# Patient Record
Sex: Female | Born: 1945 | Race: White | Hispanic: No | Marital: Married
Health system: Southern US, Community
[De-identification: ages and names within clinical notes are randomized; demographics above are authoritative.]

---

## 2016-12-29 ENCOUNTER — Emergency Department (HOSPITAL_COMMUNITY): Payer: Medicare HMO

## 2016-12-29 ENCOUNTER — Emergency Department (HOSPITAL_COMMUNITY)
Admission: EM | Admit: 2016-12-29 | Discharge: 2016-12-29 | Disposition: A | Discharge status: Home / Self Care | Payer: Medicare HMO | Attending: Emergency Medicine | Admitting: Emergency Medicine

## 2016-12-29 DIAGNOSIS — R52 Pain, unspecified: Secondary | ICD-10-CM

## 2016-12-29 DIAGNOSIS — S99912A Unspecified injury of left ankle, initial encounter: Secondary | ICD-10-CM | POA: Diagnosis present

## 2016-12-29 DIAGNOSIS — Y9301 Activity, walking, marching and hiking: Secondary | ICD-10-CM | POA: Diagnosis not present

## 2016-12-29 DIAGNOSIS — Y999 Unspecified external cause status: Secondary | ICD-10-CM | POA: Diagnosis not present

## 2016-12-29 DIAGNOSIS — Y92520 Airport as the place of occurrence of the external cause: Secondary | ICD-10-CM | POA: Diagnosis not present

## 2016-12-29 DIAGNOSIS — S82852A Displaced trimalleolar fracture of left lower leg, initial encounter for closed fracture: Secondary | ICD-10-CM | POA: Insufficient documentation

## 2016-12-29 DIAGNOSIS — W010XXA Fall on same level from slipping, tripping and stumbling without subsequent striking against object, initial encounter: Secondary | ICD-10-CM | POA: Diagnosis not present

## 2016-12-29 MED ORDER — FENTANYL CITRATE (PF) 100 MCG/2ML IJ SOLN
50.0000 ug | Freq: Once | INTRAMUSCULAR | Status: AC
  Administered 2016-12-29: 50 ug via INTRAVENOUS
  Filled 2016-12-29: qty 2

## 2016-12-29 MED ORDER — PROPOFOL 10 MG/ML IV BOLUS
0.5000 mg/kg | Freq: Once | INTRAVENOUS | Status: AC
  Administered 2016-12-29: 25 mg via INTRAVENOUS
  Filled 2016-12-29: qty 20

## 2016-12-29 MED ORDER — OXYCODONE-ACETAMINOPHEN 5-325 MG PO TABS: 1.0000 | ORAL_TABLET | Freq: Four times a day (QID) | ORAL | 0 refills | Status: AC | PRN

## 2016-12-29 MED ORDER — OXYCODONE-ACETAMINOPHEN 5-325 MG PO TABS
2.0000 | ORAL_TABLET | Freq: Once | ORAL | Status: AC
  Administered 2016-12-29: 2 via ORAL
  Filled 2016-12-29: qty 2

## 2016-12-29 MED ORDER — KETAMINE HCL-SODIUM CHLORIDE 100-0.9 MG/10ML-% IV SOSY
1.0000 mg/kg | PREFILLED_SYRINGE | Freq: Once | INTRAVENOUS | Status: AC
  Administered 2016-12-29: 25 mg via INTRAVENOUS
  Filled 2016-12-29: qty 10

## 2016-12-29 NOTE — ED Triage Notes (Signed)
[place] and [place]. Pt presents with obvious deformity to LLE, swelling, and pain 10/10

## 2016-12-29 NOTE — Discharge Instructions (Signed)
Please keep the leg elevated AT ALL TIMES - TOES ABOVE THE NOSE.  No weight bearing allowed. See the Orthopedist within 1 week.

## 2016-12-29 NOTE — ED Provider Notes (Addendum)
[facility]-EMERGENCY DEPT Provider Note   CSN: [id number] Arrival date & time: 12/29/16  1305   By signing my name below, I, Laura Gallagher, attest that this documentation has been prepared under the direction and in the presence of Derwood Kaplan, MD . Electronically Signed: Freida Gallagher, Scribe. 12/29/2016. 2:58 PM.  History   Chief Complaint Chief Complaint  Patient presents with  . Leg Injury    The history is provided by the patient. No language interpreter was used.    HPI Comments:  Laura Gallagher is a 71 y.o. female who presents to the Emergency Department complaining of constant pain to the left leg s/p injury ~1210 today. She tripped over her suitcase while walking. No LOC. Pt notes associated pain to her buttocks. She has no other acute complaints or injuries. She denies h/o injury to the left ankle. Pt last ate ~0730/0800 this AM. Pt weighed 112 pounds this AM.    No past medical history on file.  There are no active problems to display for this patient.   No past surgical history on file.  OB History    No data available       Home Medications    Prior to Admission medications   Medication Sig Start Date End Date Taking? Authorizing Provider  acetaminophen (TYLENOL) 500 MG tablet Take 1,000 mg by mouth every 6 (six) hours as needed for moderate pain.   Yes Historical Provider, MD  calcium-vitamin D (OSCAL WITH D) 500-200 MG-UNIT tablet Take 1 tablet by mouth daily with breakfast.   Yes Historical Provider, MD  cetirizine (ZYRTEC) 10 MG tablet Take 10 mg by mouth daily.   Yes Historical Provider, MD  ibuprofen (ADVIL,MOTRIN) 200 MG tablet Take 400 mg by mouth every 6 (six) hours as needed for moderate pain.   Yes Historical Provider, MD  lisinopril (PRINIVIL,ZESTRIL) 30 MG tablet Take 30 mg by mouth daily.  10/18/16  Yes Historical Provider, MD  LYRICA 75 MG capsule Take 75 mg by mouth at bedtime.  12/20/16  Yes Historical Provider, MD  magnesium oxide (MAG-OX) 400  MG tablet Take 400-600 mg by mouth daily. Takes 1 tablet one night and 1&1/2 the next night.Last dose was 1 tablet.   Yes Historical Provider, MD  oxcarbazepine (TRILEPTAL) 600 MG tablet Take 600 mg by mouth 2 (two) times daily.  11/23/16  Yes Historical Provider, MD  oxyCODONE-acetaminophen (PERCOCET/ROXICET) 5-325 MG tablet Take 1-2 tablets by mouth every 6 (six) hours as needed for severe pain. 12/29/16   Derwood Kaplan, MD    Family History No family history on file.  Social History Social History  Substance Use Topics  . Smoking status: Not on file  . Smokeless tobacco: Not on file  . Alcohol use Not on file     Allergies   Patient has no known allergies.   Review of Systems Review of Systems 10 systems reviewed and all are negative for acute change except as noted in the HPI.   Physical Exam Updated Vital Signs BP (!) 156/83 (BP Location: Right Arm)   Pulse 85   Temp 97.5 F (36.4 C) (Oral)   Resp 16   Wt 112 lb (50.8 kg)   SpO2 100%   Physical Exam  Constitutional: She is oriented to person, place, and time. She appears well-developed and well-nourished. No distress.  HENT:  Head: Normocephalic and atraumatic.  Eyes: Conjunctivae are normal.  Neck: Normal range of motion.  No midline C-spine tenderness  Cardiovascular: Normal rate, regular  rhythm and normal heart sounds.   Pulmonary/Chest: Effort normal and breath sounds normal. No respiratory distress.  Lungs clear to auscultation  Abdominal: Soft. There is no tenderness.  Musculoskeletal: She exhibits deformity.  Deformed left ankle/foot. Pt has 2+ dorsalis pedis sensation intact.  Neurological: She is alert and oriented to person, place, and time.  Skin: Skin is warm and dry.  Psychiatric: She has a normal mood and affect.  Nursing note and vitals reviewed.    ED Treatments / Results  DIAGNOSTIC STUDIES:  Oxygen Saturation is 98% on RA, normal by my interpretation.    COORDINATION OF CARE:  2:54  PM Pt in need of reduction. Discussed treatment plan with pt at bedside and pt agreed to plan.  Labs (all labs ordered are listed, but only abnormal results are displayed) Labs Reviewed - No data to display  EKG  EKG Interpretation None       Radiology Dg Ankle Complete Left  Result Date: 12/29/2016 CLINICAL DATA:  Twisting left ankle after falling on luggage. EXAM: LEFT ANKLE COMPLETE - 3+ VIEW COMPARISON:  None. FINDINGS: Three-view study limited by patient positioning. Comminuted fracture dislocation noted at the ankle. Medial malleolar fracture is associated with distal fibula fracture and posterolateral dislocation of the talar dome relative to the distal tibia. Associated posterior lip fracture distal tibia suspected. IMPRESSION: Trimalleolar fracture dislocation of the left ankle. Electronically Signed   By: Kennith Center M.D.   On: 12/29/2016 15:02   Dg Ankle Left Port  Result Date: 12/29/2016 CLINICAL DATA:  Post reduction of tibiotalar dislocation EXAM: PORTABLE LEFT ANKLE - 2 VIEW COMPARISON:  Pre reduction films of 12/29/2016 FINDINGS: The tibiotalar dislocation has been reduced. Fractures of the medial, lateral, and posterior malleoli again are noted. IMPRESSION: Reduction of tibiotalar dislocation. Electronically Signed   By: Dwyane Dee M.D.   On: 12/29/2016 17:00    Procedures Reduction of fracture Date/Time: 12/29/2016 5:04 PM Performed by: Derwood Kaplan Authorized by: Derwood Kaplan  Consent: Verbal consent obtained. Written consent obtained. Risks and benefits: risks, benefits and alternatives were discussed Consent given by: patient Patient understanding: patient states understanding of the procedure being performed Patient consent: the patient's understanding of the procedure matches consent given Procedure consent: procedure consent matches procedure scheduled Relevant documents: relevant documents present and verified Required items: required blood  products, implants, devices, and special equipment available Patient identity confirmed: arm band Time out: Immediately prior to procedure a "time out" was called to verify the correct patient, procedure, equipment, support staff and site/side marked as required. Preparation: Patient was prepped and draped in the usual sterile fashion. Local anesthesia used: no  Anesthesia: Local anesthesia used: no Patient tolerance: Patient tolerated the procedure well with no immediate complications     Procedural sedation Performed by: Derwood Kaplan Consent: Verbal consent obtained. Risks and benefits: risks, benefits and alternatives were discussed Required items: required blood products, implants, devices, and special equipment available Patient identity confirmed: arm band and provided demographic data Time out: Immediately prior to procedure a "time out" was called to verify the correct patient, procedure, equipment, support staff and site/side marked as required.  Sedation type: moderate (conscious) sedation NPO time confirmed and considedered  Sedatives: KETAMINE 25 mg and PROPOFOL 25 mg  Physician Time at Bedside: 24 min  Vitals: Vital signs were monitored during sedation. Cardiac Monitor, pulse oximeter Patient tolerance: Patient tolerated the procedure well with no immediate complications. Comments: Pt with uneventful recovered. Returned to pre-procedural sedation baseline    (  including critical care time)    Medications Ordered in ED Medications  oxyCODONE-acetaminophen (PERCOCET/ROXICET) 5-325 MG per tablet 2 tablet (not administered)  fentaNYL (SUBLIMAZE) injection 50 mcg (50 mcg Intravenous Given 12/29/16 1502)  ketamine 100 mg in normal saline 10 mL (10mg /mL) syringe (25 mg Intravenous Given 12/29/16 1630)  propofol (DIPRIVAN) 10 mg/mL bolus/IV push 25.4 mg (25 mg Intravenous Given 12/29/16 1629)     Initial Impression / Assessment and Plan / ED Course  I have reviewed  the triage vital signs and the nursing notes.  Pertinent labs & imaging results that were available during my care of the patient were reviewed by me and considered in my medical decision making (see chart for details).  Clinical Course as of Dec 29 1824  Wed Dec 29, 2016  1824 Pt is back to baseline. Husband has wheel chair at home - so he doesn't want to wait. We will help patient into the car here. Pt will see Orthopedist in [place]. Strict ER return precautions have been discussed, and patient is agreeing with the plan and is comfortable with the workup done and the recommendations from the ER.   [AN]    Clinical Course User Index [AN] Derwood KaplanAnkit Eustacio Ellen, MD    Pt comes in with cc of fall. She has suffered a trimalleolar fracture unfortunately. Fracture/dislocation reduced. Dr. Linna CapriceSwinteck has reviewed the post reduction films and is satisfied. [place] and [place] to get a wheel chair. We will recommend f/u with orthopedist in 1 week, either here [place].  Pt is neurovascularly intact after the reduction.    Final Clinical Impressions(s) / ED Diagnoses   Final diagnoses:  Trimalleolar fracture of ankle, closed, left, initial encounter    New Prescriptions New Prescriptions   OXYCODONE-ACETAMINOPHEN (PERCOCET/ROXICET) 5-325 MG TABLET    Take 1-2 tablets by mouth every 6 (six) hours as needed for severe pain.   I personally performed the services described in this documentation, which was scribed in my presence. The recorded information has been reviewed and is accurate.     Derwood KaplanAnkit Brandy Kabat, MD 12/29/16 1706    Derwood KaplanAnkit Chosen Garron, MD 12/29/16 [id number]

## 2018-05-29 IMAGING — DX DG ANKLE COMPLETE 3+V*L*
3 series · 3 of 3 positions shown · non-contrast
Comparison: None.

CLINICAL DATA: Twisting left ankle after falling on luggage.

EXAM:
LEFT ANKLE COMPLETE - 3+ VIEW

[ankle ap]
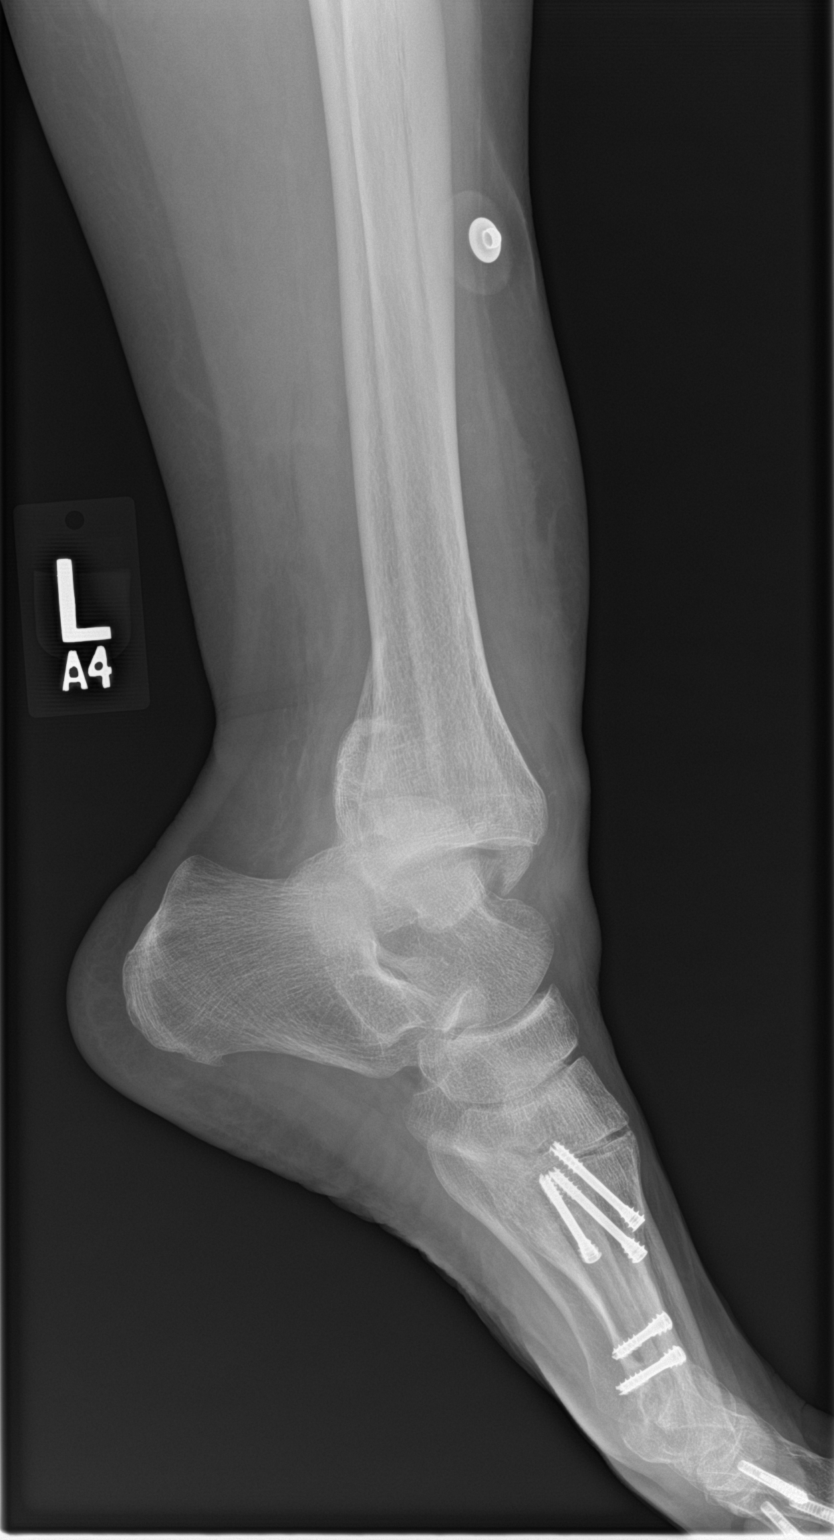

[ankle obl (1 of 2)]
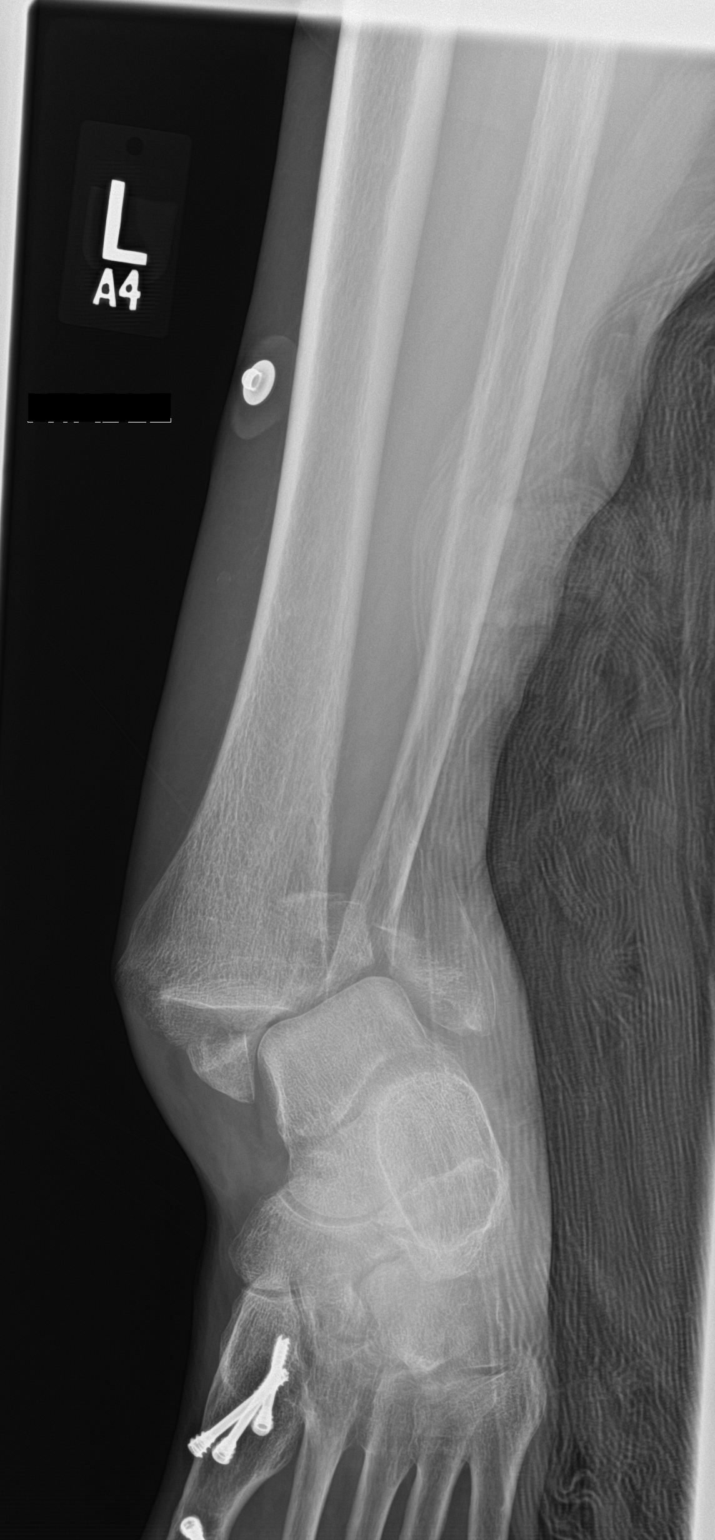

[ankle obl (2 of 2)]
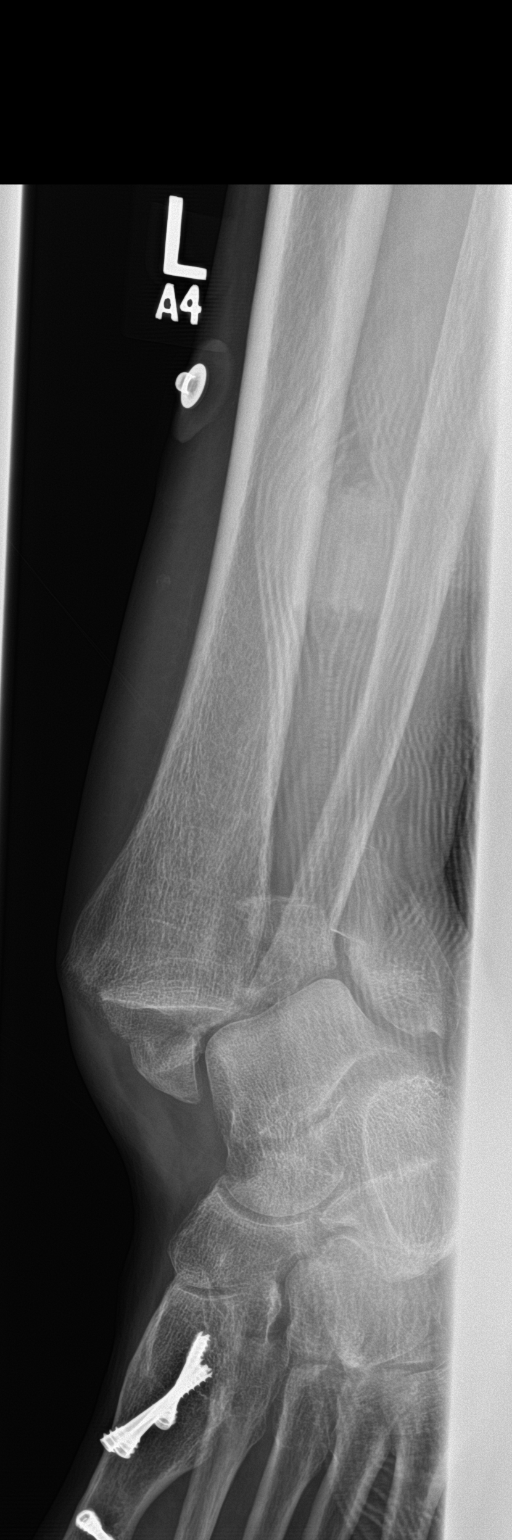

[3 of 3 positions shown; findings below may reference images not displayed]

FINDINGS: Three-view study limited by patient positioning. Comminuted fracture
dislocation noted at the ankle. Medial malleolar fracture is
associated with distal fibula fracture and posterolateral
dislocation of the talar dome relative to the distal tibia.
Associated posterior lip fracture distal tibia suspected.
IMPRESSION: Trimalleolar fracture dislocation of the left ankle.
